# Patient Record
Sex: Female | Born: 1970
Health system: Southern US, Community
[De-identification: ages and names within clinical notes are randomized; demographics above are authoritative.]

## PROBLEM LIST (undated history)

## (undated) DIAGNOSIS — F419 Anxiety disorder, unspecified: Secondary | ICD-10-CM

## (undated) DIAGNOSIS — E559 Vitamin D deficiency, unspecified: Secondary | ICD-10-CM

## (undated) HISTORY — DX: Vitamin D deficiency, unspecified: E55.9

## (undated) HISTORY — DX: Anxiety disorder, unspecified: F41.9

---

## 1995-01-02 HISTORY — PX: WISDOM TOOTH EXTRACTION: SHX21

## 2008-01-02 HISTORY — PX: DILATION AND CURETTAGE OF UTERUS: SHX78

## 2011-02-27 ENCOUNTER — Other Ambulatory Visit: Payer: Self-pay | Admitting: Obstetrics and Gynecology

## 2011-02-27 DIAGNOSIS — R928 Other abnormal and inconclusive findings on diagnostic imaging of breast: Secondary | ICD-10-CM

## 2011-03-02 ENCOUNTER — Ambulatory Visit
Admission: RE | Admit: 2011-03-02 | Discharge: 2011-03-02 | Disposition: A | Discharge status: Home / Self Care | Payer: 59 | Source: Ambulatory Visit | Attending: Obstetrics and Gynecology | Admitting: Obstetrics and Gynecology

## 2011-03-02 DIAGNOSIS — R928 Other abnormal and inconclusive findings on diagnostic imaging of breast: Secondary | ICD-10-CM

## 2012-10-14 ENCOUNTER — Other Ambulatory Visit: Payer: Self-pay | Admitting: Obstetrics and Gynecology

## 2012-10-14 DIAGNOSIS — Z803 Family history of malignant neoplasm of breast: Secondary | ICD-10-CM

## 2012-10-14 DIAGNOSIS — R922 Inconclusive mammogram: Secondary | ICD-10-CM

## 2016-10-23 ENCOUNTER — Other Ambulatory Visit: Payer: Self-pay | Admitting: Obstetrics and Gynecology

## 2016-10-23 DIAGNOSIS — R928 Other abnormal and inconclusive findings on diagnostic imaging of breast: Secondary | ICD-10-CM

## 2016-10-26 ENCOUNTER — Other Ambulatory Visit: Payer: Self-pay | Admitting: Obstetrics and Gynecology

## 2016-10-26 ENCOUNTER — Ambulatory Visit
Admission: RE | Admit: 2016-10-26 | Discharge: 2016-10-26 | Disposition: A | Discharge status: Home / Self Care | Payer: PRIVATE HEALTH INSURANCE | Source: Ambulatory Visit | Attending: Obstetrics and Gynecology | Admitting: Obstetrics and Gynecology

## 2016-10-26 DIAGNOSIS — R921 Mammographic calcification found on diagnostic imaging of breast: Secondary | ICD-10-CM

## 2016-10-26 DIAGNOSIS — R928 Other abnormal and inconclusive findings on diagnostic imaging of breast: Secondary | ICD-10-CM

## 2017-05-03 ENCOUNTER — Ambulatory Visit
Admission: RE | Admit: 2017-05-03 | Discharge: 2017-05-03 | Disposition: A | Discharge status: Home / Self Care | Payer: PRIVATE HEALTH INSURANCE | Source: Ambulatory Visit | Attending: Obstetrics and Gynecology | Admitting: Obstetrics and Gynecology

## 2017-05-03 ENCOUNTER — Other Ambulatory Visit: Payer: Self-pay | Admitting: Obstetrics and Gynecology

## 2017-05-03 DIAGNOSIS — R921 Mammographic calcification found on diagnostic imaging of breast: Secondary | ICD-10-CM

## 2017-09-18 DIAGNOSIS — H5213 Myopia, bilateral: Secondary | ICD-10-CM | POA: Diagnosis not present

## 2017-09-18 DIAGNOSIS — H524 Presbyopia: Secondary | ICD-10-CM | POA: Diagnosis not present

## 2017-09-19 MED FILL — ALPRAZolam 0.25 MG TABS: 0.25 | 10 days supply | Qty: 30 | Fill #0

## 2017-10-22 DIAGNOSIS — Z113 Encounter for screening for infections with a predominantly sexual mode of transmission: Secondary | ICD-10-CM | POA: Diagnosis not present

## 2017-10-22 DIAGNOSIS — N760 Acute vaginitis: Secondary | ICD-10-CM | POA: Diagnosis not present

## 2017-10-22 MED FILL — AZITHROMYCIN 500 MG TABLET: 500 | 1 days supply | Qty: 2 | Fill #0

## 2017-11-18 MED FILL — ESTRADIOL 2 MG TABS: 2 | 30 days supply | Qty: 30 | Fill #0

## 2017-11-19 DIAGNOSIS — F411 Generalized anxiety disorder: Secondary | ICD-10-CM | POA: Diagnosis not present

## 2017-12-31 MED FILL — ESTRADIOL 2 MG TABS: 2 | 30 days supply | Qty: 30 | Fill #1

## 2018-01-22 DIAGNOSIS — D235 Other benign neoplasm of skin of trunk: Secondary | ICD-10-CM | POA: Diagnosis not present

## 2018-01-22 DIAGNOSIS — Z23 Encounter for immunization: Secondary | ICD-10-CM | POA: Diagnosis not present

## 2018-01-22 DIAGNOSIS — L603 Nail dystrophy: Secondary | ICD-10-CM | POA: Diagnosis not present

## 2018-02-13 MED FILL — ESTRADIOL 2 MG TABS: 2 | 30 days supply | Qty: 30 | Fill #0

## 2018-03-05 DIAGNOSIS — Z113 Encounter for screening for infections with a predominantly sexual mode of transmission: Secondary | ICD-10-CM | POA: Diagnosis not present

## 2018-03-05 DIAGNOSIS — Z6828 Body mass index (BMI) 28.0-28.9, adult: Secondary | ICD-10-CM | POA: Diagnosis not present

## 2018-03-05 DIAGNOSIS — F419 Anxiety disorder, unspecified: Secondary | ICD-10-CM | POA: Diagnosis not present

## 2018-03-05 DIAGNOSIS — Z01419 Encounter for gynecological examination (general) (routine) without abnormal findings: Secondary | ICD-10-CM | POA: Diagnosis not present

## 2018-04-03 DIAGNOSIS — N879 Dysplasia of cervix uteri, unspecified: Secondary | ICD-10-CM | POA: Diagnosis not present

## 2018-04-03 DIAGNOSIS — R87610 Atypical squamous cells of undetermined significance on cytologic smear of cervix (ASC-US): Secondary | ICD-10-CM | POA: Diagnosis not present

## 2018-05-13 DIAGNOSIS — L603 Nail dystrophy: Secondary | ICD-10-CM | POA: Diagnosis not present

## 2018-06-09 MED FILL — ESTRADIOL 2 MG TABS: 2 | 90 days supply | Qty: 90 | Fill #0

## 2018-09-01 MED FILL — ESTRADIOL 2 MG TABS: 2 | 90 days supply | Qty: 90 | Fill #1

## 2018-09-11 MED FILL — ESTRADIOL 2 MG TABS: 2 | 90 days supply | Qty: 90 | Fill #1

## 2018-10-02 DIAGNOSIS — H524 Presbyopia: Secondary | ICD-10-CM | POA: Diagnosis not present

## 2018-10-02 DIAGNOSIS — H5213 Myopia, bilateral: Secondary | ICD-10-CM | POA: Diagnosis not present

## 2018-10-13 DIAGNOSIS — N870 Mild cervical dysplasia: Secondary | ICD-10-CM | POA: Diagnosis not present

## 2018-10-13 DIAGNOSIS — R87610 Atypical squamous cells of undetermined significance on cytologic smear of cervix (ASC-US): Secondary | ICD-10-CM | POA: Diagnosis not present

## 2018-10-29 MED FILL — ESTRADIOL 2 MG TABS: 2 | 90 days supply | Qty: 90 | Fill #1

## 2019-01-21 MED FILL — ESTRADIOL 2 MG TABS: 2 | 90 days supply | Qty: 90 | Fill #2

## 2019-02-09 MED FILL — ESTRADIOL 2 MG TABS: 2 | 90 days supply | Qty: 90 | Fill #2

## 2019-05-20 MED FILL — ESTRADIOL 2 MG TABS: 2 | 90 days supply | Qty: 90 | Fill #0

## 2019-06-02 DIAGNOSIS — H1045 Other chronic allergic conjunctivitis: Secondary | ICD-10-CM | POA: Diagnosis not present

## 2019-06-29 DIAGNOSIS — H0011 Chalazion right upper eyelid: Secondary | ICD-10-CM | POA: Diagnosis not present

## 2019-07-10 DIAGNOSIS — Z719 Counseling, unspecified: Secondary | ICD-10-CM

## 2019-07-17 DIAGNOSIS — H0011 Chalazion right upper eyelid: Secondary | ICD-10-CM | POA: Diagnosis not present

## 2019-08-13 ENCOUNTER — Other Ambulatory Visit: Payer: Self-pay | Admitting: Obstetrics and Gynecology

## 2019-08-13 ENCOUNTER — Other Ambulatory Visit (HOSPITAL_COMMUNITY): Payer: Self-pay | Admitting: Obstetrics and Gynecology

## 2019-08-13 DIAGNOSIS — R87612 Low grade squamous intraepithelial lesion on cytologic smear of cervix (LGSIL): Secondary | ICD-10-CM | POA: Diagnosis not present

## 2019-08-13 DIAGNOSIS — Z01419 Encounter for gynecological examination (general) (routine) without abnormal findings: Secondary | ICD-10-CM | POA: Diagnosis not present

## 2019-08-13 DIAGNOSIS — R921 Mammographic calcification found on diagnostic imaging of breast: Secondary | ICD-10-CM

## 2019-08-13 DIAGNOSIS — Z6828 Body mass index (BMI) 28.0-28.9, adult: Secondary | ICD-10-CM | POA: Diagnosis not present

## 2019-08-13 DIAGNOSIS — F419 Anxiety disorder, unspecified: Secondary | ICD-10-CM | POA: Diagnosis not present

## 2019-08-13 DIAGNOSIS — N951 Menopausal and female climacteric states: Secondary | ICD-10-CM | POA: Diagnosis not present

## 2019-08-14 MED FILL — ALPRAZolam 0.25 MG TABS: 0.25 | 10 days supply | Qty: 30 | Fill #0

## 2019-08-14 MED FILL — ESTRADIOL 2 MG TABS: 2 | 30 days supply | Qty: 30 | Fill #0

## 2019-09-18 DIAGNOSIS — Z719 Counseling, unspecified: Secondary | ICD-10-CM

## 2019-09-18 MED FILL — ESTRADIOL 2 MG TABS: 2 | 30 days supply | Qty: 30 | Fill #1

## 2019-09-28 ENCOUNTER — Ambulatory Visit
Admission: RE | Admit: 2019-09-28 | Discharge: 2019-09-28 | Disposition: A | Discharge status: Home / Self Care | Payer: 59 | Source: Ambulatory Visit | Attending: Obstetrics and Gynecology | Admitting: Obstetrics and Gynecology

## 2019-09-28 ENCOUNTER — Other Ambulatory Visit: Payer: Self-pay

## 2019-09-28 DIAGNOSIS — R921 Mammographic calcification found on diagnostic imaging of breast: Secondary | ICD-10-CM | POA: Diagnosis not present

## 2019-10-19 MED FILL — ESTRADIOL 2 MG TABS: 2 | 30 days supply | Qty: 30 | Fill #2

## 2019-11-20 MED FILL — ESTRADIOL 2 MG TABS: 2 | 30 days supply | Qty: 30 | Fill #3

## 2019-12-18 MED FILL — ESTRADIOL 2 MG TABS: 2 | 30 days supply | Qty: 30 | Fill #4

## 2020-01-18 MED FILL — ESTRADIOL 2 MG TABS: 2 | 30 days supply | Qty: 30 | Fill #5

## 2020-02-24 MED FILL — ESTRADIOL 2 MG TABS: 2 | 30 days supply | Qty: 30 | Fill #6

## 2020-03-04 DIAGNOSIS — Z20822 Contact with and (suspected) exposure to covid-19: Secondary | ICD-10-CM | POA: Diagnosis not present

## 2020-03-28 MED FILL — ESTRADIOL 2 MG TABS: 2 | 30 days supply | Qty: 30 | Fill #7

## 2020-05-12 ENCOUNTER — Other Ambulatory Visit (HOSPITAL_COMMUNITY): Payer: Self-pay

## 2020-05-12 MED FILL — Estradiol Tab 2 MG: ORAL | 30 days supply | Qty: 30 | Fill #0 | Status: AC

## 2020-05-13 ENCOUNTER — Other Ambulatory Visit (HOSPITAL_COMMUNITY): Payer: Self-pay

## 2020-06-13 MED FILL — Estradiol Tab 2 MG: ORAL | 30 days supply | Qty: 30 | Fill #1 | Status: AC

## 2020-06-14 ENCOUNTER — Other Ambulatory Visit (HOSPITAL_COMMUNITY): Payer: Self-pay

## 2020-07-12 MED FILL — Estradiol Tab 2 MG: ORAL | 30 days supply | Qty: 30 | Fill #2 | Status: AC

## 2020-07-13 ENCOUNTER — Other Ambulatory Visit (HOSPITAL_COMMUNITY): Payer: Self-pay

## 2020-08-11 MED FILL — Estradiol Tab 2 MG: ORAL | 30 days supply | Qty: 30 | Fill #3 | Status: CN

## 2020-08-12 ENCOUNTER — Other Ambulatory Visit (HOSPITAL_COMMUNITY): Payer: Self-pay

## 2020-08-16 ENCOUNTER — Other Ambulatory Visit (HOSPITAL_COMMUNITY): Payer: Self-pay

## 2020-08-16 DIAGNOSIS — N959 Unspecified menopausal and perimenopausal disorder: Secondary | ICD-10-CM | POA: Diagnosis not present

## 2020-08-16 DIAGNOSIS — Z01419 Encounter for gynecological examination (general) (routine) without abnormal findings: Secondary | ICD-10-CM | POA: Diagnosis not present

## 2020-08-16 DIAGNOSIS — F419 Anxiety disorder, unspecified: Secondary | ICD-10-CM | POA: Diagnosis not present

## 2020-08-16 DIAGNOSIS — Z1211 Encounter for screening for malignant neoplasm of colon: Secondary | ICD-10-CM | POA: Diagnosis not present

## 2020-08-16 DIAGNOSIS — Z6827 Body mass index (BMI) 27.0-27.9, adult: Secondary | ICD-10-CM | POA: Diagnosis not present

## 2020-08-16 MED ORDER — ESTRADIOL 2 MG PO TABS
2.0000 mg | ORAL_TABLET | Freq: Every day | ORAL | 12 refills | Status: AC
  Filled 2020-08-16 (×2): qty 30, 30d supply, fill #0
  Filled 2020-09-14: qty 30, 30d supply, fill #1
  Filled 2020-10-12: qty 30, 30d supply, fill #2
  Filled 2020-11-13: qty 30, 30d supply, fill #3
  Filled 2020-12-20: qty 30, 30d supply, fill #4
  Filled 2021-01-19: qty 30, 30d supply, fill #5
  Filled 2021-02-20: qty 30, 30d supply, fill #6
  Filled 2021-03-28: qty 30, 30d supply, fill #7

## 2020-08-16 MED ORDER — ALPRAZOLAM 0.25 MG PO TABS
0.2500 mg | ORAL_TABLET | Freq: Three times a day (TID) | ORAL | 0 refills | Status: AC | PRN
  Filled 2020-08-16: qty 30, 10d supply, fill #0

## 2020-09-14 ENCOUNTER — Other Ambulatory Visit (HOSPITAL_COMMUNITY): Payer: Self-pay

## 2020-10-13 ENCOUNTER — Other Ambulatory Visit (HOSPITAL_COMMUNITY): Payer: Self-pay

## 2020-11-13 ENCOUNTER — Other Ambulatory Visit (HOSPITAL_COMMUNITY): Payer: Self-pay

## 2020-11-14 ENCOUNTER — Other Ambulatory Visit (HOSPITAL_COMMUNITY): Payer: Self-pay

## 2020-12-07 DIAGNOSIS — G4719 Other hypersomnia: Secondary | ICD-10-CM | POA: Diagnosis not present

## 2020-12-08 DIAGNOSIS — G4719 Other hypersomnia: Secondary | ICD-10-CM | POA: Diagnosis not present

## 2020-12-20 ENCOUNTER — Other Ambulatory Visit (HOSPITAL_COMMUNITY): Payer: Self-pay

## 2021-01-20 ENCOUNTER — Other Ambulatory Visit (HOSPITAL_COMMUNITY): Payer: Self-pay

## 2021-01-27 ENCOUNTER — Encounter: Payer: Self-pay | Admitting: Gastroenterology

## 2021-02-06 ENCOUNTER — Other Ambulatory Visit (HOSPITAL_COMMUNITY): Payer: Self-pay

## 2021-02-06 ENCOUNTER — Ambulatory Visit (AMBULATORY_SURGERY_CENTER): Payer: 59

## 2021-02-06 ENCOUNTER — Other Ambulatory Visit: Payer: Self-pay

## 2021-02-06 VITALS — Ht 69.0 in | Wt 175.0 lb

## 2021-02-06 DIAGNOSIS — Z1211 Encounter for screening for malignant neoplasm of colon: Secondary | ICD-10-CM

## 2021-02-06 MED ORDER — NA SULFATE-K SULFATE-MG SULF 17.5-3.13-1.6 GM/177ML PO SOLN
1.0000 | Freq: Once | ORAL | 0 refills | Status: AC
  Filled 2021-02-06 – 2021-02-20 (×2): qty 354, 1d supply, fill #0

## 2021-02-06 NOTE — Progress Notes (Signed)
No egg or soy allergy known to patient  No issues known to pt with past sedation with any surgeries or procedures Patient denies ever being told they had issues or difficulty with intubation  No FH of Malignant Hyperthermia Pt is not on diet pills Pt is not on home 02  Pt is not on blood thinners  Pt denies issues with constipation;  No A fib or A flutter Pt is fully vaccinated for Covid x 2; NO PA's for preps discussed with pt in PV today  Discussed with pt there will be an out-of-pocket cost for prep and that varies from $0 to 70 + dollars - pt verbalized understanding  Due to the COVID-19 pandemic we are asking patients to follow certain guidelines in PV and the [facility]   Pt aware of COVID protocols and [facility] guidelines  PV completed over the phone. Pt verified name, DOB, address and insurance during PV today.  Pt mailed instruction packet with copy of consent form to read and not return, and instructions.  Pt encouraged to call with questions or issues.  If pt has My chart, procedure instructions sent via My Chart

## 2021-02-15 ENCOUNTER — Other Ambulatory Visit (HOSPITAL_COMMUNITY): Payer: Self-pay

## 2021-02-20 ENCOUNTER — Encounter: Payer: Self-pay | Admitting: Gastroenterology

## 2021-02-20 ENCOUNTER — Other Ambulatory Visit (HOSPITAL_COMMUNITY): Payer: Self-pay

## 2021-02-20 DIAGNOSIS — R87612 Low grade squamous intraepithelial lesion on cytologic smear of cervix (LGSIL): Secondary | ICD-10-CM | POA: Diagnosis not present

## 2021-02-23 ENCOUNTER — Other Ambulatory Visit: Payer: Self-pay

## 2021-02-23 ENCOUNTER — Ambulatory Visit (AMBULATORY_SURGERY_CENTER): Payer: 59 | Admitting: Gastroenterology

## 2021-02-23 ENCOUNTER — Encounter: Payer: Self-pay | Admitting: Gastroenterology

## 2021-02-23 VITALS — BP 124/56 | HR 67 | Temp 97.8°F | Resp 13 | Ht 69.0 in | Wt 175.0 lb

## 2021-02-23 DIAGNOSIS — Z1211 Encounter for screening for malignant neoplasm of colon: Secondary | ICD-10-CM | POA: Diagnosis not present

## 2021-02-23 MED ORDER — SODIUM CHLORIDE 0.9 % IV SOLN: 500.0000 mL | Freq: Once | INTRAVENOUS | Status: DC

## 2021-02-23 NOTE — Op Note (Signed)
[facility] Patient Name: Darlene Kennedy Procedure Date: 02/23/2021 9:36 AM MRN: [id number] Endoscopist: Nicki Reaper E. Candis Schatz , MD Age: 51 Referring MD:  Date of Birth: 12-31-1970 Gender: Female Account #: [id number] Procedure:                Colonoscopy Indications:              Screening for colorectal malignant neoplasm, This                            is the patient's first colonoscopy Medicines:                Monitored Anesthesia Care Procedure:                Pre-Anesthesia Assessment:                           - Prior to the procedure, a History and Physical                            was performed, and patient medications and                            allergies were reviewed. The patient's tolerance of                            previous anesthesia was also reviewed. The risks                            and benefits of the procedure and the sedation                            options and risks were discussed with the patient.                            All questions were answered, and informed consent                            was obtained. Prior Anticoagulants: The patient has                            taken no previous anticoagulant or antiplatelet                            agents. ASA Grade Assessment: I - A normal, healthy                            patient. After reviewing the risks and benefits,                            the patient was deemed in satisfactory condition to                            undergo the procedure.  After obtaining informed consent, the colonoscope                            was passed under direct vision. Throughout the                            procedure, the patient's blood pressure, pulse, and                            oxygen saturations were monitored continuously. The                            CF [id number] #[id number] was introduced through the anus                            and advanced to the the  terminal ileum, with                            identification of the appendiceal orifice and IC                            valve. The colonoscopy was performed without                            difficulty. The patient tolerated the procedure                            well. The quality of the bowel preparation was                            good. The terminal ileum, ileocecal valve,                            appendiceal orifice, and rectum were photographed.                            The bowel preparation used was SUPREP via split                            dose instruction. Scope In: 9:47:36 AM Scope Out: 10:04:54 AM Scope Withdrawal Time: 0 hours 10 minutes 51 seconds  Total Procedure Duration: 0 hours 17 minutes 18 seconds  Findings:                 External hemorrhoids were found on perianal exam.                           The digital rectal exam was normal. Pertinent                            negatives include normal sphincter tone and no                            palpable rectal lesions.  The colon (entire examined portion) appeared normal.                           The terminal ileum appeared normal.                           Non-bleeding internal hemorrhoids were found during                            retroflexion. The hemorrhoids were Grade I                            (internal hemorrhoids that do not prolapse).                           No additional abnormalities were found on                            retroflexion. Complications:            No immediate complications. Estimated Blood Loss:     Estimated blood loss: none. Impression:               - Hemorrhoids found on perianal exam.                           - The entire examined colon is normal.                           - The examined portion of the ileum was normal.                           - Non-bleeding internal hemorrhoids.                           - No specimens  collected. Recommendation:           - Patient has a contact number available for                            emergencies. The signs and symptoms of potential                            delayed complications were discussed with the                            patient. Return to normal activities tomorrow.                            Written discharge instructions were provided to the                            patient.                           - Resume previous diet.                           -  Continue present medications.                           - Repeat colonoscopy in 10 years for screening                            purposes. [name], MD 02/23/2021 10:10:31 AM This report has been signed electronically.

## 2021-02-23 NOTE — Patient Instructions (Signed)
Handout on hemorrhoids given.  YOU HAD AN ENDOSCOPIC PROCEDURE TODAY AT [facility]:   Refer to the procedure report that was given to you for any specific questions about what was found during the examination.  If the procedure report does not answer your questions, please call your gastroenterologist to clarify.  If you requested that your care partner not be given the details of your procedure findings, then the procedure report has been included in a sealed envelope for you to review at your convenience later.  YOU SHOULD EXPECT: Some feelings of bloating in the abdomen. Passage of more gas than usual.  Walking can help get rid of the air that was put into your GI tract during the procedure and reduce the bloating. If you had a lower endoscopy (such as a colonoscopy or flexible sigmoidoscopy) you may notice spotting of blood in your stool or on the toilet paper. If you underwent a bowel prep for your procedure, you may not have a normal bowel movement for a few days.  Please Note:  You might notice some irritation and congestion in your nose or some drainage.  This is from the oxygen used during your procedure.  There is no need for concern and it should clear up in a day or so.  SYMPTOMS TO REPORT IMMEDIATELY:  Following lower endoscopy (colonoscopy or flexible sigmoidoscopy):  Excessive amounts of blood in the stool  Significant tenderness or worsening of abdominal pains  Swelling of the abdomen that is new, acute  Fever of 100F or higher  For urgent or emergent issues, a gastroenterologist can be reached at any hour by calling [phone]. Do not use MyChart messaging for urgent concerns.    DIET:  We do recommend a small meal at first, but then you may proceed to your regular diet.  Drink plenty of fluids but you should avoid alcoholic beverages for 24 hours.  ACTIVITY:  You should plan to take it easy for the rest of today and you should NOT DRIVE or use heavy  machinery until tomorrow (because of the sedation medicines used during the test).    FOLLOW UP: Our staff will call the number listed on your records 48-72 hours following your procedure to check on you and address any questions or concerns that you may have regarding the information given to you following your procedure. If we do not reach you, we will leave a message.  We will attempt to reach you two times.  During this call, we will ask if you have developed any symptoms of COVID 19. If you develop any symptoms (ie: fever, flu-like symptoms, shortness of breath, cough etc.) before then, please call [phone].  If you test positive for Covid 19 in the 2 weeks post procedure, please call and report this information to [place].    If any biopsies were taken you will be contacted by phone or by letter within the next 1-3 weeks.  Please call us at [phone] if you have not heard about the biopsies in 3 weeks.    SIGNATURES/CONFIDENTIALITY: You and/or your care partner have signed paperwork which will be entered into your electronic medical record.  These signatures attest to the fact that that the information above on your After Visit Summary has been reviewed and is understood.  Full responsibility of the confidentiality of this discharge information lies with you and/or your care-partner.

## 2021-02-23 NOTE — Progress Notes (Signed)
Pt non-responsive, VVS, Report to RN  °

## 2021-02-23 NOTE — Progress Notes (Signed)
Pt's states no medical or surgical changes since previsit or office visit. VS assessed by C.W 

## 2021-02-23 NOTE — Progress Notes (Signed)
[facility] History and Physical   Primary Care Physician:  Louretta Shorten, MD   Reason for Procedure:   Colon cancer screening  Plan:    Screening colonoscopy     HPI: Darlene Kennedy is a 51 y.o. female undergoing initial average risk screening colonoscopy.  She has no family history of colon cancer and no chronic GI symptoms.    Past Medical History:  Diagnosis Date   Anxiety    hx of   Vitamin D deficiency    hx of    Past Surgical History:  Procedure Laterality Date   DILATION AND CURETTAGE OF UTERUS  2010   WISDOM TOOTH EXTRACTION  1997    Prior to Admission medications   Medication Sig Start Date End Date Taking? Authorizing Provider  estradiol (ESTRACE) 2 MG tablet TAKE 1 TABLET BY MOUTH ONCE DAILY 08/16/20  Yes   Multiple Vitamin (MULTIVITAMIN ADULT PO) Take 1 tablet by mouth daily at 6 (six) AM.   Yes [provider]  ALPRAZolam (XANAX) 0.25 MG tablet TAKE 1 TABLET BY MOUTH EVERY 8 HOURS AS NEEDED FOR ANXIETY. 08/16/20       Current Outpatient Medications  Medication Sig Dispense Refill   estradiol (ESTRACE) 2 MG tablet TAKE 1 TABLET BY MOUTH ONCE DAILY 30 tablet 12   Multiple Vitamin (MULTIVITAMIN ADULT PO) Take 1 tablet by mouth daily at 6 (six) AM.     ALPRAZolam (XANAX) 0.25 MG tablet TAKE 1 TABLET BY MOUTH EVERY 8 HOURS AS NEEDED FOR ANXIETY. 30 tablet 0   Current Facility-Administered Medications  Medication Dose Route Frequency Provider Last Rate Last Admin   0.9 %  sodium chloride infusion  500 mL Intravenous Once Daryel November, MD        Allergies as of 02/23/2021   (No Known Allergies)    Family History  Problem Relation Age of Onset   Prostate cancer Father    Colon polyps Neg Hx    Colon cancer Neg Hx    Esophageal cancer Neg Hx    Rectal cancer Neg Hx    Stomach cancer Neg Hx     Social History   Socioeconomic History   Marital status: Divorced    Spouse name: Not on file   Number of children: Not  on file   Years of education: Not on file   Highest education level: Not on file  Occupational History   Not on file  Tobacco Use   Smoking status: Never   Smokeless tobacco: Never  Vaping Use   Vaping Use: Never used  Substance and Sexual Activity   Alcohol use: Yes    Alcohol/week: 3.0 - 4.0 standard drinks    Types: 3 - 4 Standard drinks or equivalent per week   Drug use: Never   Sexual activity: Not on file  Other Topics Concern   Not on file  Social History Narrative   Not on file   Social Determinants of Health   Financial Resource Strain: Not on file  Food Insecurity: Not on file  Transportation Needs: Not on file  Physical Activity: Not on file  Stress: Not on file  Social Connections: Not on file  Intimate Partner Violence: Not on file    Review of Systems:  All other review of systems negative except as mentioned in the HPI.  Physical Exam: Vital signs BP (!) 143/101    Pulse 69    Temp 97.8 F (36.6 C) (Skin)    Ht 5'  9" (1.753 m)    Wt 175 lb (79.4 kg)    SpO2 100%    BMI 25.84 kg/m   General:   Alert,  Well-developed, well-nourished, pleasant and cooperative in NAD Airway:  Mallampati 1 Lungs:  Clear throughout to auscultation.   Heart:  Regular rate and rhythm; no murmurs, clicks, rubs,  or gallops. Abdomen:  Soft, nontender and nondistended. Normal bowel sounds.   Neuro/Psych:  Normal mood and affect. A and O x 3   Ioanna Colquhoun E. Candis Schatz, MD [facility]

## 2021-02-27 ENCOUNTER — Telehealth: Payer: Self-pay

## 2021-02-27 NOTE — Telephone Encounter (Signed)
°  Follow up Call-  Call back number 02/23/2021  Post procedure Call Back phone  # [phone]  Permission to leave phone message Yes  Some recent data might be hidden     Patient questions:  Do you have a fever, pain , or abdominal swelling? No. Pain Score  0 *  Have you tolerated food without any problems? Yes.    Have you been able to return to your normal activities? Yes.    Do you have any questions about your discharge instructions: Diet   No. Medications  Yes.   Follow up visit  No.  Do you have questions or concerns about your Care? No.  Actions: * If pain score is 4 or above: No action needed, pain <4.  Have you developed a fever since your procedure? no  2.   Have you had an respiratory symptoms (SOB or cough) since your procedure? no  3.   Have you tested positive for COVID 19 since your procedure no  4.   Have you had any family members/close contacts diagnosed with the COVID 19 since your procedure?  no   If yes to any of these questions please route to Joylene John, RN and Joella Prince, RN

## 2021-03-28 IMAGING — MG DIGITAL DIAGNOSTIC BILAT W/ TOMO W/ CAD
8 of 11 series · 8 of 27 positions shown · non-contrast
Comparison: Previous exam(s).

CLINICAL DATA: 49-year-old female presenting for delayed follow-up
of right breast calcifications from 4734.

EXAM:
DIGITAL DIAGNOSTIC BILATERAL MAMMOGRAM WITH TOMO AND CAD

[R ML (1 of 2)]
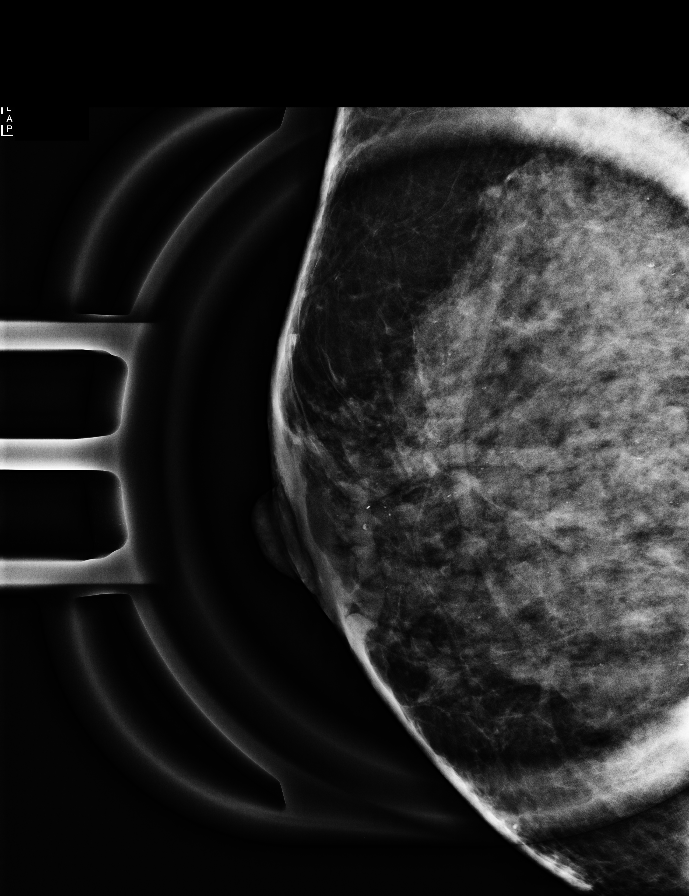

[R CC]
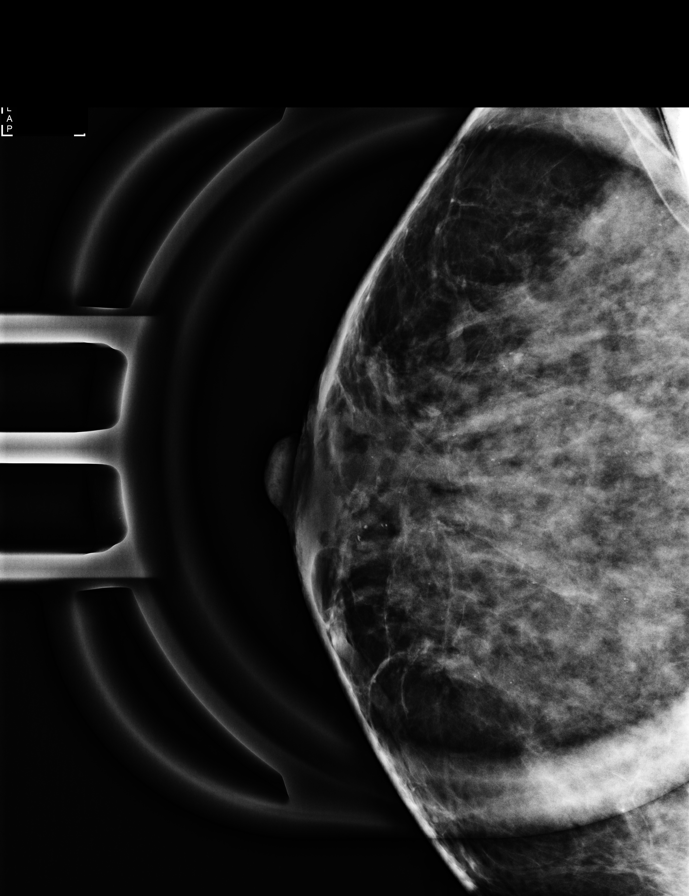

[R ML (2 of 2)]
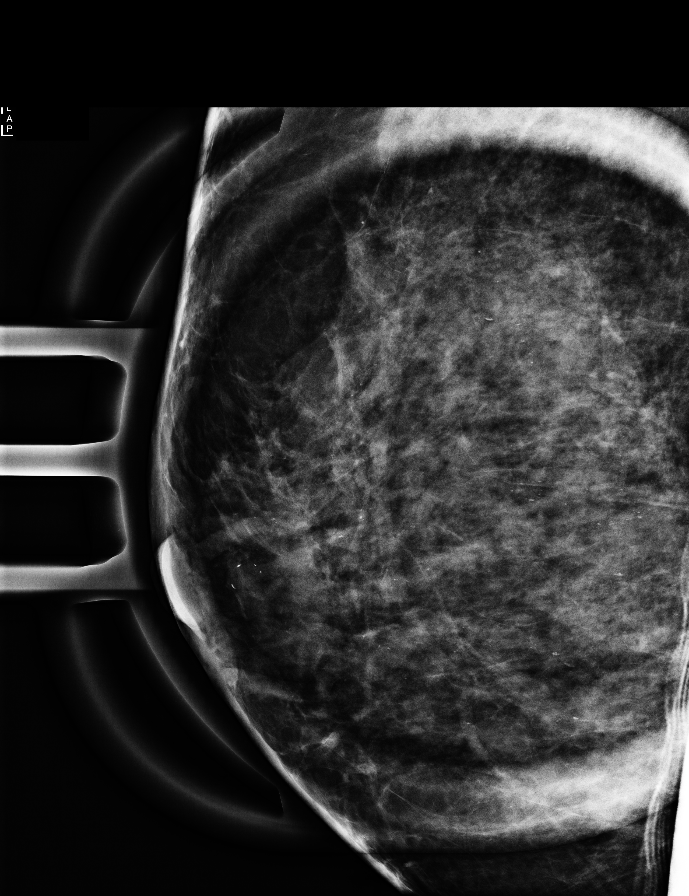

[R CC synth-2D]
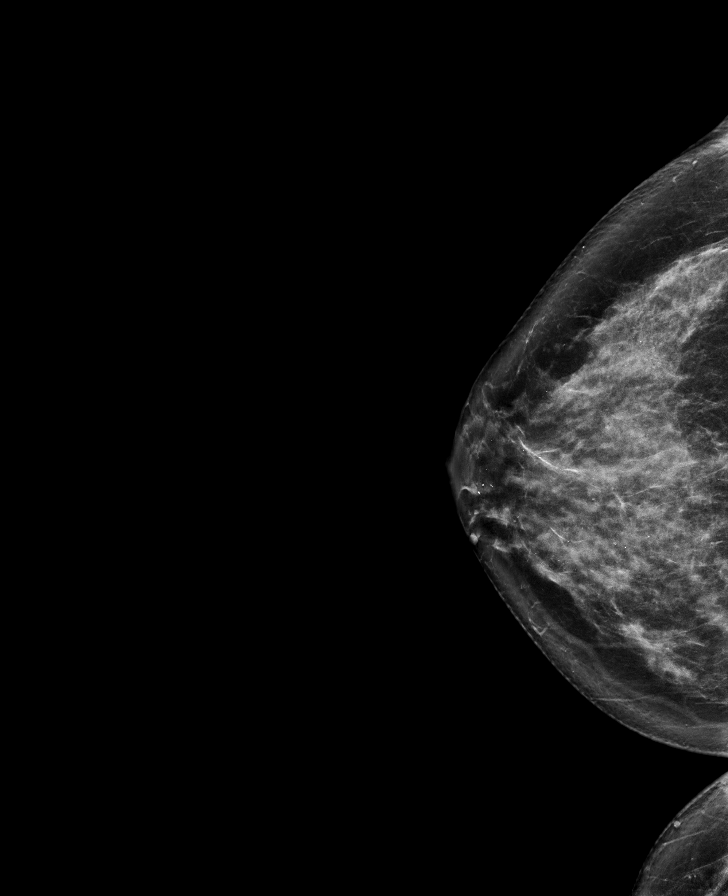

[L CC synth-2D]
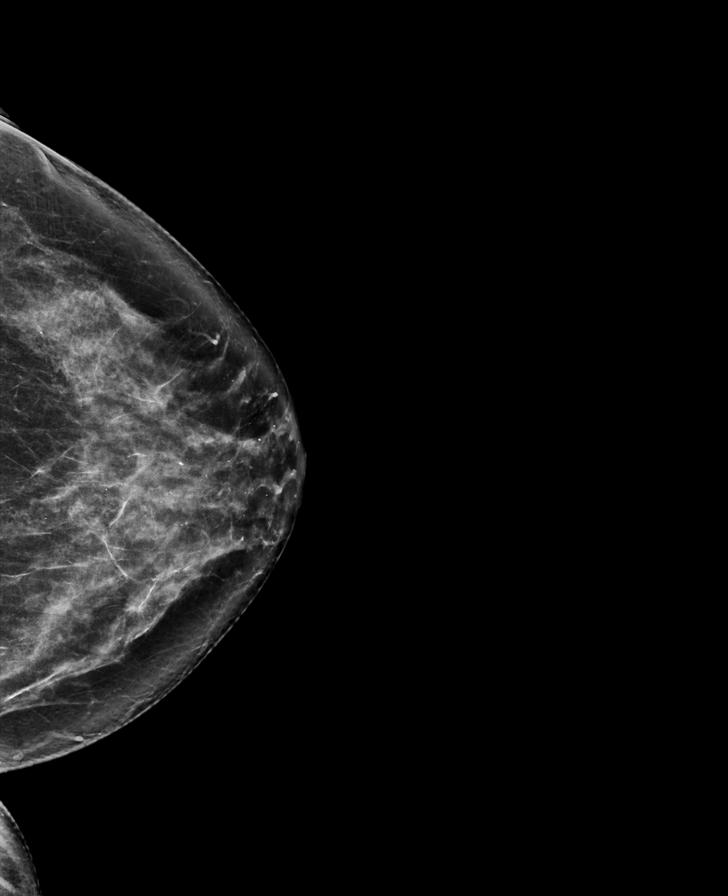

[R MLO synth-2D]
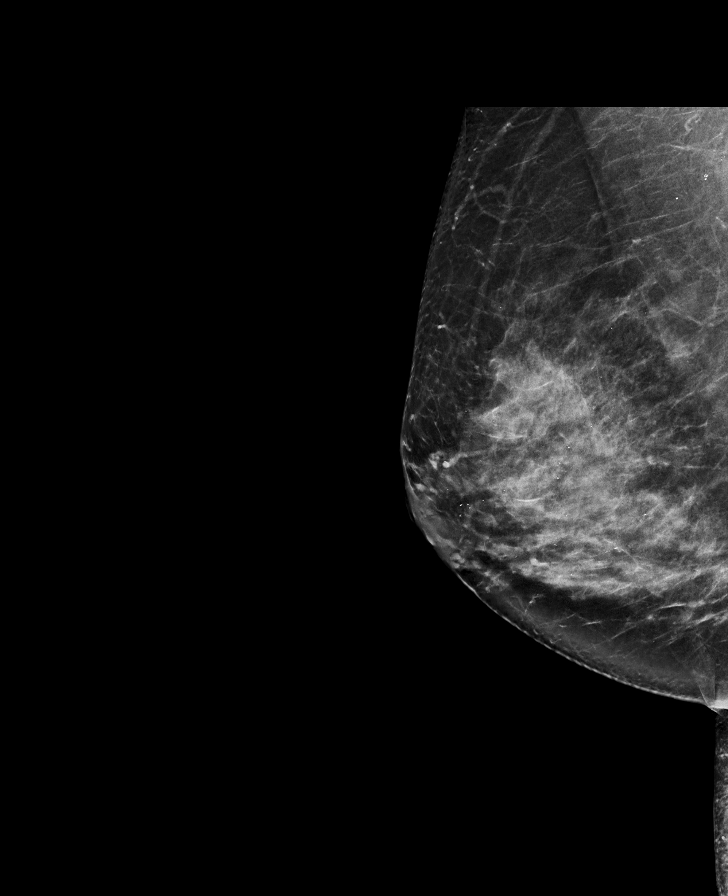

[L MLO synth-2D]
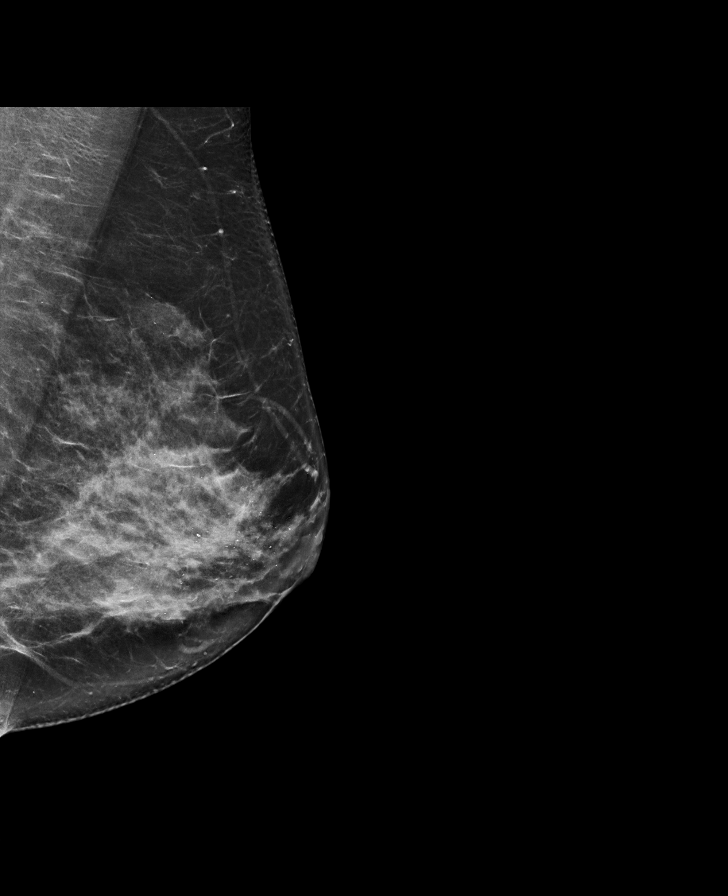

[L CC tomo · tomo slice 45/88.0]
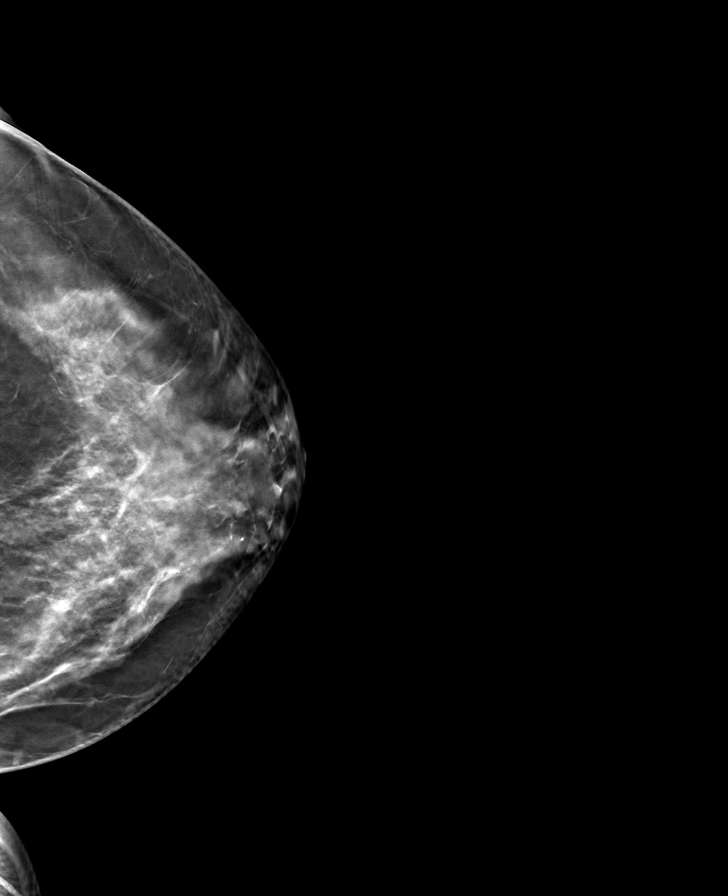

[8 of 27 positions shown; findings below may reference images not displayed]

ACR Breast Density Category c: The breast tissue is heterogeneously
dense, which may obscure small masses.
FINDINGS: Punctate calcifications in the subareolar right breast are
mammographically stable. Additional calcifications scattered
throughout the remainder of the right breast appear E more facet on
the cc projection with layering on the ML projection consistent with
benign milk of calcium.

No additional new or suspicious mammographic findings are identified
in either breast.

Mammographic images were processed with CAD.
IMPRESSION: 1. No mammographic evidence of malignancy in either breast.
2. Benign right breast calcifications. No further imaging follow-up
required.

RECOMMENDATION:
Screening mammogram in one year.(Code:[id number])

I have discussed the findings and recommendations with the patient.
If applicable, a reminder letter will be sent to the patient
regarding the next appointment.

BI-RADS CATEGORY  2: Benign.

## 2021-03-29 ENCOUNTER — Other Ambulatory Visit (HOSPITAL_COMMUNITY): Payer: Self-pay

## 2021-04-28 ENCOUNTER — Other Ambulatory Visit (HOSPITAL_COMMUNITY): Payer: Self-pay
